# Patient Record
Sex: Female | Born: 1995 | Race: White | Hispanic: No | Marital: Single | Smoking: Former smoker
Health system: Southern US, Community
[De-identification: ages and names within clinical notes are randomized; demographics above are authoritative.]

## PROBLEM LIST (undated history)

## (undated) DIAGNOSIS — F4310 Post-traumatic stress disorder, unspecified: Secondary | ICD-10-CM

## (undated) DIAGNOSIS — F429 Obsessive-compulsive disorder, unspecified: Secondary | ICD-10-CM

## (undated) DIAGNOSIS — M549 Dorsalgia, unspecified: Secondary | ICD-10-CM

## (undated) DIAGNOSIS — F502 Bulimia nervosa, unspecified: Secondary | ICD-10-CM

## (undated) HISTORY — PX: NO PAST SURGERIES: SHX2092

---
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
## 2016-04-25 ENCOUNTER — Emergency Department (HOSPITAL_COMMUNITY)
Admission: EM | Admit: 2016-04-25 | Discharge: 2016-04-25 | Disposition: A | Discharge status: Home / Self Care | Payer: Self-pay | Attending: Dermatology | Admitting: Dermatology

## 2016-04-25 DIAGNOSIS — Z5321 Procedure and treatment not carried out due to patient leaving prior to being seen by health care provider: Secondary | ICD-10-CM | POA: Insufficient documentation

## 2016-04-25 NOTE — ED Notes (Signed)
The pt was not triaged she left whenever she received her vitals  She decided she did not want to stay  left

## 2016-04-30 ENCOUNTER — Emergency Department (HOSPITAL_COMMUNITY): Payer: Self-pay

## 2016-04-30 ENCOUNTER — Encounter (HOSPITAL_COMMUNITY): Payer: Self-pay

## 2016-04-30 ENCOUNTER — Emergency Department (HOSPITAL_COMMUNITY)
Admission: EM | Admit: 2016-04-30 | Discharge: 2016-04-30 | Disposition: A | Discharge status: Home / Self Care | Payer: Self-pay | Attending: Emergency Medicine | Admitting: Emergency Medicine

## 2016-04-30 DIAGNOSIS — F17200 Nicotine dependence, unspecified, uncomplicated: Secondary | ICD-10-CM | POA: Insufficient documentation

## 2016-04-30 DIAGNOSIS — B349 Viral infection, unspecified: Secondary | ICD-10-CM | POA: Insufficient documentation

## 2016-04-30 DIAGNOSIS — K920 Hematemesis: Secondary | ICD-10-CM | POA: Insufficient documentation

## 2016-04-30 LAB — CBC WITH DIFFERENTIAL/PLATELET
BASOS ABS: 0 10*3/uL (ref 0.0–0.1)
BASOS PCT: 0 %
EOS ABS: 0.2 10*3/uL (ref 0.0–0.7)
EOS PCT: 2 %
HCT: 39.3 % (ref 36.0–46.0)
Hemoglobin: 13.4 g/dL (ref 12.0–15.0)
LYMPHS PCT: 41 %
Lymphs Abs: 3.6 10*3/uL (ref 0.7–4.0)
MCH: 30.1 pg (ref 26.0–34.0)
MCHC: 34.1 g/dL (ref 30.0–36.0)
MCV: 88.3 fL (ref 78.0–100.0)
MONO ABS: 0.5 10*3/uL (ref 0.1–1.0)
Monocytes Relative: 6 %
Neutro Abs: 4.5 10*3/uL (ref 1.7–7.7)
Neutrophils Relative %: 51 %
PLATELETS: 366 10*3/uL (ref 150–400)
RBC: 4.45 MIL/uL (ref 3.87–5.11)
RDW: 13.9 % (ref 11.5–15.5)
WBC: 8.8 10*3/uL (ref 4.0–10.5)

## 2016-04-30 LAB — COMPREHENSIVE METABOLIC PANEL
ALK PHOS: 84 U/L (ref 38–126)
ALT: 24 U/L (ref 14–54)
ANION GAP: 9 (ref 5–15)
AST: 23 U/L (ref 15–41)
Albumin: 4.6 g/dL (ref 3.5–5.0)
BUN: 7 mg/dL (ref 6–20)
CALCIUM: 9.4 mg/dL (ref 8.9–10.3)
CO2: 25 mmol/L (ref 22–32)
CREATININE: 0.6 mg/dL (ref 0.44–1.00)
Chloride: 107 mmol/L (ref 101–111)
Glucose, Bld: 95 mg/dL (ref 65–99)
Potassium: 3.9 mmol/L (ref 3.5–5.1)
SODIUM: 141 mmol/L (ref 135–145)
Total Bilirubin: 0.3 mg/dL (ref 0.3–1.2)
Total Protein: 8 g/dL (ref 6.5–8.1)

## 2016-04-30 LAB — I-STAT BETA HCG BLOOD, ED (MC, WL, AP ONLY)

## 2016-04-30 LAB — ETHANOL: ALCOHOL ETHYL (B): 5 mg/dL — AB (ref <5)

## 2016-04-30 LAB — LIPASE, BLOOD: LIPASE: 18 U/L (ref 11–51)

## 2016-04-30 MED ORDER — SUCRALFATE 1 GM/10ML PO SUSP: 1.0000 g | Freq: Three times a day (TID) | ORAL | 0 refills | Status: DC

## 2016-04-30 MED ORDER — ALBUTEROL SULFATE (2.5 MG/3ML) 0.083% IN NEBU
5.0000 mg | INHALATION_SOLUTION | Freq: Once | RESPIRATORY_TRACT | Status: DC
  Filled 2016-04-30: qty 6

## 2016-04-30 MED ORDER — OMEPRAZOLE 20 MG PO CPDR: 20.0000 mg | DELAYED_RELEASE_CAPSULE | Freq: Every day | ORAL | 0 refills | Status: DC

## 2016-04-30 MED ORDER — KETOROLAC TROMETHAMINE 30 MG/ML IJ SOLN: 10.0000 mg | Freq: Once | INTRAMUSCULAR | Status: DC

## 2016-04-30 MED ORDER — SODIUM CHLORIDE 0.9 % IV BOLUS (SEPSIS): 1000.0000 mL | Freq: Once | INTRAVENOUS | Status: DC

## 2016-04-30 NOTE — Discharge Instructions (Signed)
As discussed, today's evaluation has been largely reassuring, but with ongoing symptoms it is important that you follow-up with your primary care physician in addition to our gastroenterologist. Particular important that you monitor your condition, and if you have additional episodes of vomiting blood, or anything concerning, please return here for further evaluation.  Otherwise, please take all medication as directed.

## 2016-04-30 NOTE — ED Notes (Signed)
Discharge instructions, follow up care, and rx x2 reviewed with patient. Patient verbalized understanding. 

## 2016-04-30 NOTE — Progress Notes (Signed)
RT went to assess patient for a breathing treatment. Patient is clear and diminished, no shortness of breath, and no wheezing. Patient does have a cough. Based on the assessment patient does not need the treatment that was ordered.

## 2016-04-30 NOTE — ED Notes (Signed)
Patient transported to X-ray 

## 2016-04-30 NOTE — ED Notes (Signed)
Nurse is in the room collecting labs 

## 2016-04-30 NOTE — ED Notes (Signed)
  Pt. Refused to give vitals. Pt. Stated that she wasn't going to give up no vitals to no one. Nurse aware.

## 2016-04-30 NOTE — ED Notes (Addendum)
Prior to starting IV/blood collection, patient refusing fluids/medication. MD notified and to bedside. Patient agreed to have blood work drawn. After blood work was drawn, patient requesting to leave. Patient tearful. Therapeutic communication used. MD aware patient wants to leave.

## 2016-04-30 NOTE — ED Provider Notes (Signed)
[facility]-EMERGENCY DEPT Provider Note   CSN: [id number] Arrival date & time: 04/30/16  0510     History   Chief Complaint Chief Complaint  Patient presents with  . Cough  . Hemoptysis    HPI Shelby Russell is a 20 y.o. female.  HPI  Patient presents with concern of generalized illness, fatigue, weakness, fever, cough, hemoptysis and hematemesis. Illness began about one week ago. Since onset she has felt generally ill, but no vomiting or hemoptysis until the past 2 days. Now she has had multiple episodes of noticeable blood in her vomit or She denies pain, including abdominal pain, chest pain, does acknowledge nausea. No overt bloody stool. No syncope, no confusion, no disorientation. Patient states that she is generally well, though she acknowledges a history of smoking.  Smoking cessation provided, particularly in light of this patient's evaluation in the ED.   History reviewed. No pertinent past medical history.  There are no active problems to display for this patient.   History reviewed. No pertinent surgical history.  OB History    No data available       Home Medications    Prior to Admission medications   Not on File    Family History History reviewed. No pertinent family history.  Social History Social History  Substance Use Topics  . Smoking status: Current Every Day Smoker  . Smokeless tobacco: Never Used  . Alcohol use Yes     Allergies   Patient has no allergy information on record.   Review of Systems Review of Systems  Constitutional:       Per HPI, otherwise negative  HENT:       Per HPI, otherwise negative  Respiratory:       Per HPI, otherwise negative  Cardiovascular:       Per HPI, otherwise negative  Gastrointestinal: Positive for nausea and vomiting. Negative for abdominal pain.  Endocrine:       Negative aside from HPI  Genitourinary:       Neg aside from HPI   Musculoskeletal:       Per HPI, otherwise negative    Skin: Positive for pallor.  Allergic/Immunologic: Negative for immunocompromised state.  Neurological: Positive for weakness. Negative for syncope.     Physical Exam Updated Vital Signs BP 118/80 (BP Location: Right Arm)   Pulse 88   Temp 98.9 F (37.2 C) (Oral)   Resp 20   LMP 04/29/2016   SpO2 100%   Physical Exam  Constitutional: She is oriented to person, place, and time. She appears well-developed and well-nourished. No distress.  HENT:  Head: Normocephalic and atraumatic.  Eyes: Conjunctivae and EOM are normal.  redness about the eyes  Cardiovascular: Normal rate and regular rhythm.   Pulmonary/Chest: Effort normal and breath sounds normal. No stridor. No respiratory distress.  Abdominal: She exhibits no distension. There is no tenderness. There is no guarding.  Musculoskeletal: She exhibits no edema.  Neurological: She is alert and oriented to person, place, and time. No cranial nerve deficit.  Skin: Skin is warm and dry. There is pallor.  Psychiatric: She has a normal mood and affect.  Slightly evasive, but clear, direct speech  Nursing note and vitals reviewed.    ED Treatments / Results  Labs (all labs ordered are listed, but only abnormal results are displayed) Labs Reviewed  COMPREHENSIVE METABOLIC PANEL  ETHANOL  LIPASE, BLOOD  CBC WITH DIFFERENTIAL/PLATELET  URINALYSIS, ROUTINE W REFLEX MICROSCOPIC (NOT AT [facility])  I-STAT BETA HCG  BLOOD, ED ([facility], [facility], [facility] ONLY)   Radiology Dg Chest 2 View  Result Date: 04/30/2016 CLINICAL DATA:  Cough and cold symptoms for over a week, getting worse. Vomiting blood. Smoker. EXAM: CHEST  2 VIEW COMPARISON:  None. FINDINGS: The heart size and mediastinal contours are within normal limits. Both lungs are clear. The visualized skeletal structures are unremarkable. IMPRESSION: No active cardiopulmonary disease. Electronically Signed   By: Burman NievesWilliam  Stevens M.D.   On: 04/30/2016 05:49   Procedures Procedures (including critical  care time)  Medications Ordered in ED Medications  albuterol (PROVENTIL) (2.5 MG/3ML) 0.083% nebulizer solution 5 mg (5 mg Nebulization Not Given 04/30/16 0619)  sodium chloride 0.9 % bolus 1,000 mL (not administered)  ketorolac (TORADOL) 30 MG/ML injection 9.9 mg (not administered)    Initial Impression / Assessment and Plan / ED Course  I have reviewed the triage vital signs and the nursing notes.  Pertinent labs & imaging results that were available during my care of the patient were reviewed by me and considered in my medical decision making (see chart for details).  Clinical Course     7:56 AM Patient requests no IVF, no IV meds.  She states that she cannot stay for complete evaluation.  9:13 AM We discussed lab results, the patientthat she has had some degree of pain, questions possibility of gastric ulcers. We discussed this possibility, importance of following up with primary care, gastroenterology, particularly if she continues to have symptoms.   Final Clinical Impressions(s) / ED Diagnoses  Young female presents with one week of illness, concern for new hemoptysis and/or hematemesis. Here she is awake, alert, but hemodynamically stable. I advocated for fluid resuscitation, monitoring in addition to labs. Initial x-ray reassuring, labs reassuring. The initial studies were reassuring, and she was provided appropriate discharge follow-up instructions, both with primary care and gastroenterology.  Patient discharged with a course of Carafate, PPI.   Gerhard Munchobert Lateasha Breuer, MD 04/30/16 [phone]

## 2016-04-30 NOTE — ED Triage Notes (Signed)
Pt states that she's had a bad cold for over a week and her coughing is getting worse,  The last few days she has been vomiting blood and this last time it was dark and looked like coffee grounds in the center

## 2016-06-07 ENCOUNTER — Encounter (HOSPITAL_COMMUNITY): Payer: Self-pay | Admitting: *Deleted

## 2018-09-20 IMAGING — CR DG CHEST 2V
2 series · 2 of 2 positions shown · non-contrast
Comparison: None.

CLINICAL DATA: Cough and cold symptoms for over a week, getting
worse. Vomiting blood. Smoker.

EXAM:
CHEST  2 VIEW

[w chest pa]
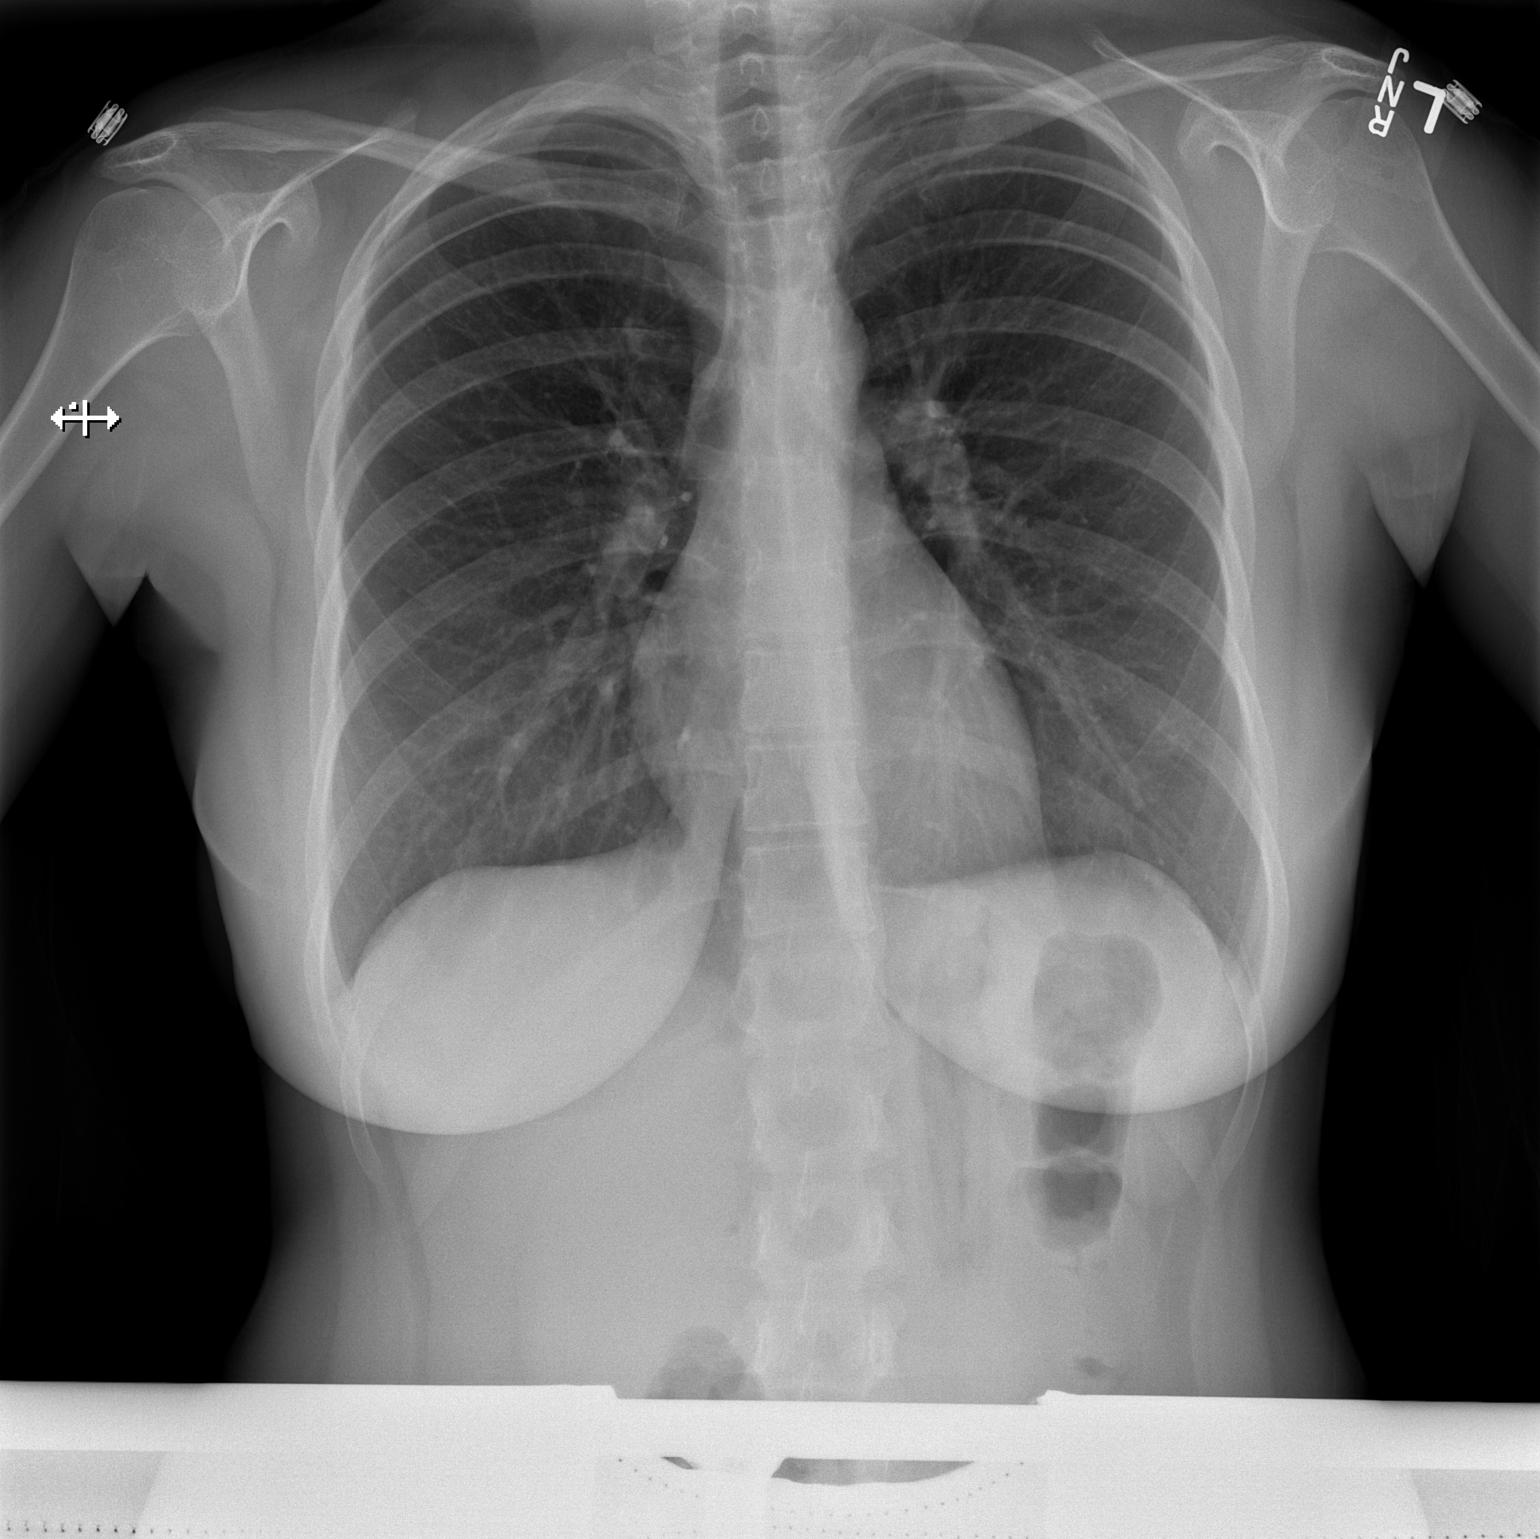

[w chest lat]
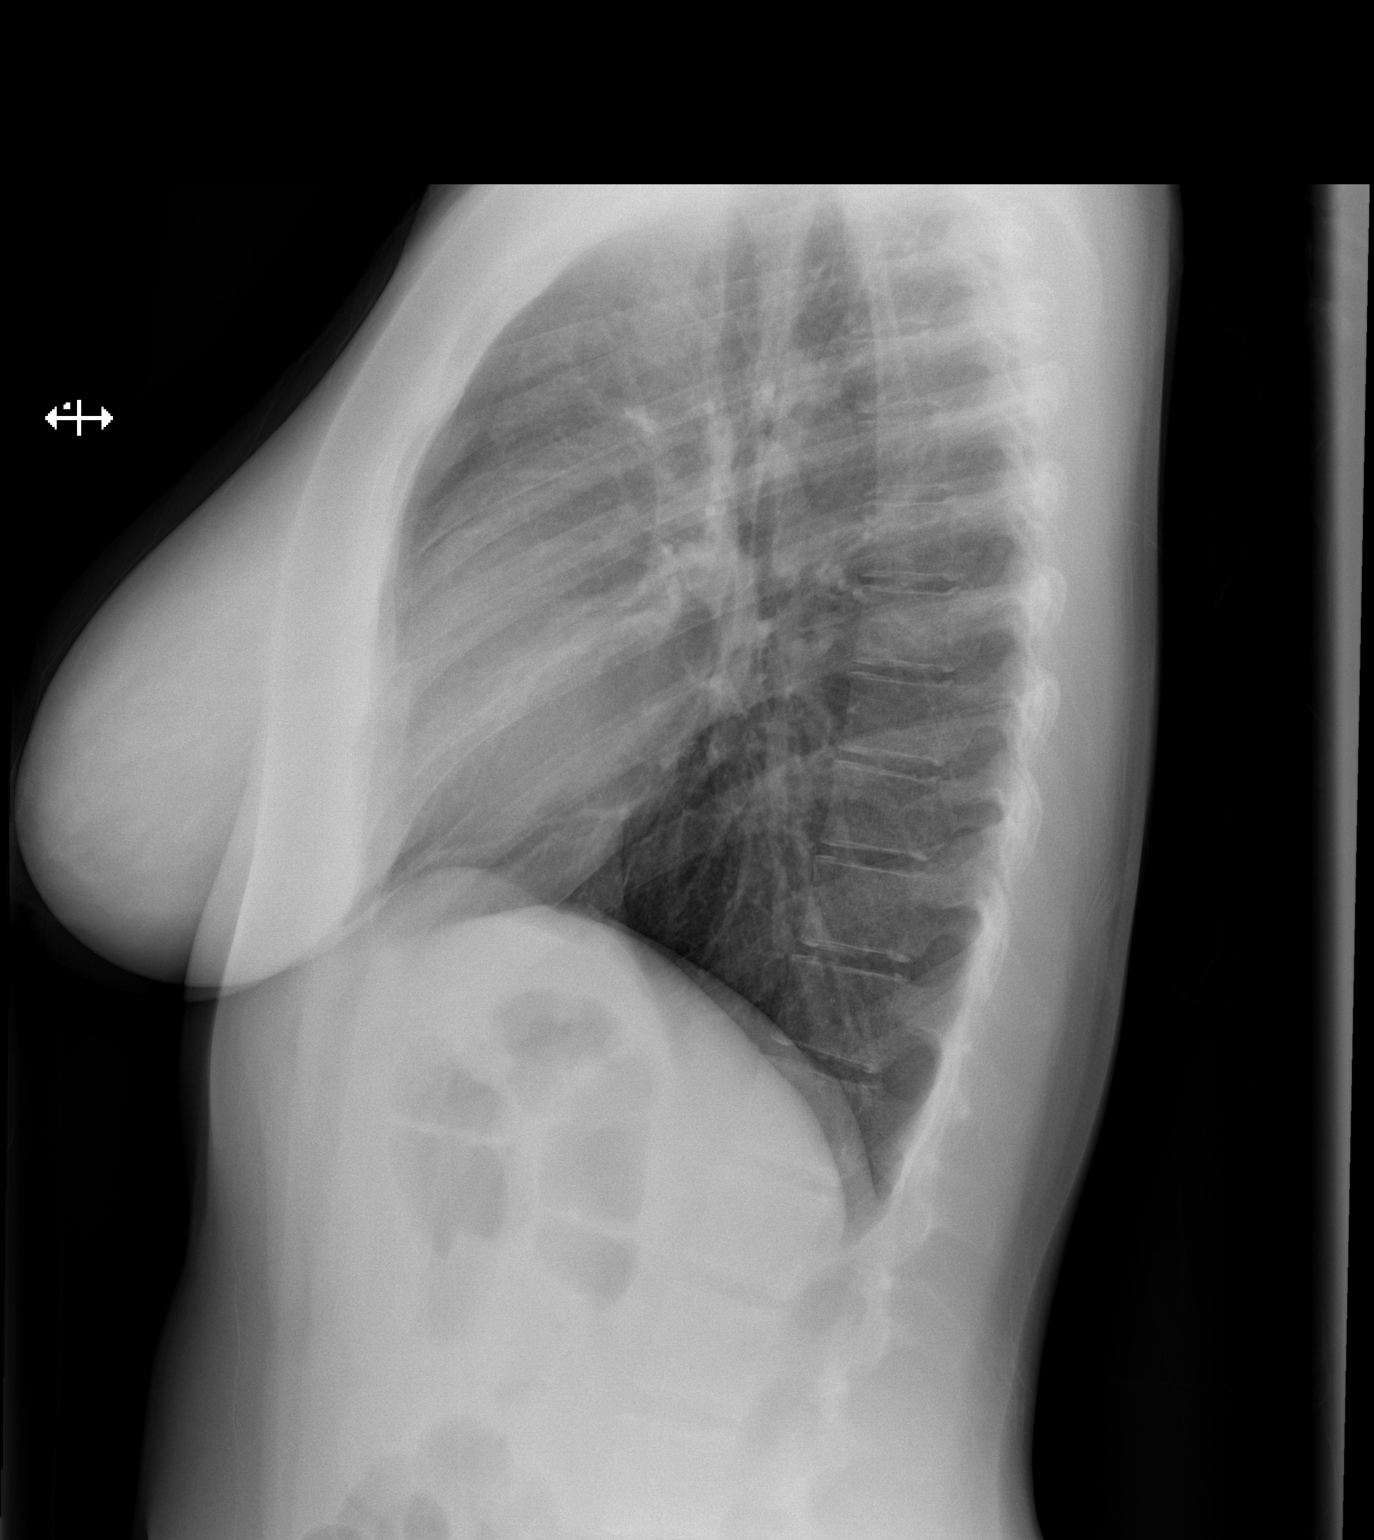

[2 of 2 positions shown; findings below may reference images not displayed]

FINDINGS: The heart size and mediastinal contours are within normal limits.
Both lungs are clear. The visualized skeletal structures are
unremarkable.
IMPRESSION: No active cardiopulmonary disease.

## 2023-02-12 ENCOUNTER — Ambulatory Visit: Payer: Self-pay

## 2023-02-15 ENCOUNTER — Ambulatory Visit (HOSPITAL_COMMUNITY)
Admission: EM | Admit: 2023-02-15 | Discharge: 2023-02-15 | Disposition: A | Discharge status: Home / Self Care | Payer: BC Managed Care – PPO | Source: Home / Self Care | Attending: Family Medicine | Admitting: Family Medicine

## 2023-02-15 ENCOUNTER — Encounter (HOSPITAL_COMMUNITY): Payer: Self-pay

## 2023-02-15 DIAGNOSIS — R051 Acute cough: Secondary | ICD-10-CM | POA: Diagnosis not present

## 2023-02-15 DIAGNOSIS — J069 Acute upper respiratory infection, unspecified: Secondary | ICD-10-CM | POA: Diagnosis not present

## 2023-02-15 MED ORDER — PROMETHAZINE-DM 6.25-15 MG/5ML PO SYRP: 5.0000 mL | ORAL_SOLUTION | Freq: Four times a day (QID) | ORAL | 0 refills | Status: DC | PRN

## 2023-02-15 NOTE — ED Triage Notes (Signed)
Here for sore throat, cough and diarrhea x 3 days.

## 2023-02-17 NOTE — ED Provider Notes (Signed)
  [facility]   [id number] 02/15/23 Arrival Time: 1421  ASSESSMENT & PLAN:  1. Viral URI   2. Acute cough    Discussed typical duration of likely viral illness. OTC symptom care as needed.  Discharge Medication List as of 02/15/2023  5:22 PM     START taking these medications   Details  promethazine-dextromethorphan (PROMETHAZINE-DM) 6.25-15 MG/5ML syrup Take 5 mLs by mouth 4 (four) times daily as needed for cough., Starting Sat 02/15/2023, Normal         Follow-up Information     [facility].   Specialty: Urgent Care Why: If worsening or failing to improve as anticipated. Contact information: [address] [place] [address] [phone]                Reviewed expectations re: course of current medical issues. Questions answered. Outlined signs and symptoms indicating need for more acute intervention. Understanding verbalized. After Visit Summary given.   SUBJECTIVE: History from: Patient. Shelby Russell is a 27 y.o. female. Here for sore throat, cough and diarrhea x 3 days.  Denies: fever and difficulty breathing. Normal PO intake without n/v/d.  OBJECTIVE:  Vitals:   02/15/23 1622  BP: 123/82  Pulse: 88  Resp: 16  Temp: 98.7 F (37.1 C)  TempSrc: Oral  SpO2: 96%    General appearance: alert; no distress Eyes: PERRLA; EOMI; conjunctiva normal HENT: [place]; AT; with nasal congestion Neck: supple  Lungs: speaks full sentences without difficulty; unlabored; dry cough Extremities: no edema Skin: warm and dry Neurologic: normal gait Psychological: alert and cooperative; normal mood and affect    Allergies  Allergen Reactions   Lamictal [Lamotrigine] Itching and Rash    Past Medical History:  Diagnosis Date   Abdominal pain 08/26/2011   Recent on-set.   Back pain 08/26/2011   Recent on-set.   Social History   Socioeconomic History   Marital status: Single    Spouse name: Not on  file   Number of children: Not on file   Years of education: Not on file   Highest education level: Not on file  Occupational History   Not on file  Tobacco Use   Smoking status: Every Day   Smokeless tobacco: Never  Substance and Sexual Activity   Alcohol use: Yes   Drug use: No   Sexual activity: Not on file  Other Topics Concern   Not on file  Social History Narrative   ** Merged History Encounter **       Social Determinants of Health   Financial Resource Strain: Not on file  Food Insecurity: Not on file  Transportation Needs: Not on file  Physical Activity: Not on file  Stress: Not on file  Social Connections: Not on file  Intimate Partner Violence: Not on file   History reviewed. No pertinent family history. Past Surgical History:  Procedure Laterality Date   NO PAST SURGERIES       Mardella Layman, MD 02/17/23 [phone]

## 2023-03-11 ENCOUNTER — Ambulatory Visit (HOSPITAL_COMMUNITY): Payer: BC Managed Care – PPO

## 2023-03-13 ENCOUNTER — Ambulatory Visit (HOSPITAL_COMMUNITY): Payer: BC Managed Care – PPO

## 2023-03-26 ENCOUNTER — Ambulatory Visit (HOSPITAL_COMMUNITY): Payer: BC Managed Care – PPO

## 2023-07-22 ENCOUNTER — Ambulatory Visit: Payer: BC Managed Care – PPO

## 2023-07-24 DIAGNOSIS — F422 Mixed obsessional thoughts and acts: Secondary | ICD-10-CM | POA: Diagnosis not present

## 2023-07-24 DIAGNOSIS — F411 Generalized anxiety disorder: Secondary | ICD-10-CM | POA: Diagnosis not present

## 2023-07-24 DIAGNOSIS — F4312 Post-traumatic stress disorder, chronic: Secondary | ICD-10-CM | POA: Diagnosis not present

## 2023-08-21 DIAGNOSIS — F422 Mixed obsessional thoughts and acts: Secondary | ICD-10-CM | POA: Diagnosis not present

## 2023-08-21 DIAGNOSIS — F4312 Post-traumatic stress disorder, chronic: Secondary | ICD-10-CM | POA: Diagnosis not present

## 2023-08-21 DIAGNOSIS — F411 Generalized anxiety disorder: Secondary | ICD-10-CM | POA: Diagnosis not present

## 2023-08-21 DIAGNOSIS — F902 Attention-deficit hyperactivity disorder, combined type: Secondary | ICD-10-CM | POA: Diagnosis not present

## 2023-10-14 DIAGNOSIS — F4312 Post-traumatic stress disorder, chronic: Secondary | ICD-10-CM | POA: Diagnosis not present

## 2023-10-14 DIAGNOSIS — F411 Generalized anxiety disorder: Secondary | ICD-10-CM | POA: Diagnosis not present

## 2023-10-14 DIAGNOSIS — F422 Mixed obsessional thoughts and acts: Secondary | ICD-10-CM | POA: Diagnosis not present

## 2023-10-20 ENCOUNTER — Other Ambulatory Visit: Payer: Self-pay

## 2023-10-20 ENCOUNTER — Emergency Department (HOSPITAL_COMMUNITY)

## 2023-10-20 ENCOUNTER — Emergency Department (HOSPITAL_COMMUNITY)
Admission: EM | Admit: 2023-10-20 | Discharge: 2023-10-20 | Disposition: A | Discharge status: Home / Self Care | Attending: Emergency Medicine | Admitting: Emergency Medicine

## 2023-10-20 ENCOUNTER — Encounter (HOSPITAL_COMMUNITY): Payer: Self-pay

## 2023-10-20 DIAGNOSIS — K529 Noninfective gastroenteritis and colitis, unspecified: Secondary | ICD-10-CM

## 2023-10-20 DIAGNOSIS — R0789 Other chest pain: Secondary | ICD-10-CM | POA: Diagnosis not present

## 2023-10-20 DIAGNOSIS — N3000 Acute cystitis without hematuria: Secondary | ICD-10-CM | POA: Insufficient documentation

## 2023-10-20 DIAGNOSIS — R079 Chest pain, unspecified: Secondary | ICD-10-CM | POA: Diagnosis not present

## 2023-10-20 DIAGNOSIS — R112 Nausea with vomiting, unspecified: Secondary | ICD-10-CM | POA: Diagnosis not present

## 2023-10-20 LAB — URINALYSIS, W/ REFLEX TO CULTURE (INFECTION SUSPECTED)
Bilirubin Urine: NEGATIVE
Glucose, UA: NEGATIVE mg/dL
Hgb urine dipstick: NEGATIVE
Ketones, ur: NEGATIVE mg/dL
Leukocytes,Ua: NEGATIVE
Nitrite: POSITIVE — AB
Protein, ur: NEGATIVE mg/dL
Specific Gravity, Urine: 1.023 (ref 1.005–1.030)
pH: 5 (ref 5.0–8.0)

## 2023-10-20 LAB — RESP PANEL BY RT-PCR (RSV, FLU A&B, COVID)  RVPGX2
Influenza A by PCR: NEGATIVE
Influenza B by PCR: NEGATIVE
Resp Syncytial Virus by PCR: NEGATIVE
SARS Coronavirus 2 by RT PCR: NEGATIVE

## 2023-10-20 LAB — BASIC METABOLIC PANEL WITH GFR
Anion gap: 10 (ref 5–15)
BUN: 11 mg/dL (ref 6–20)
CO2: 21 mmol/L — ABNORMAL LOW (ref 22–32)
Calcium: 9 mg/dL (ref 8.9–10.3)
Chloride: 106 mmol/L (ref 98–111)
Creatinine, Ser: 0.71 mg/dL (ref 0.44–1.00)
GFR, Estimated: >60 mL/min (ref >60)
Glucose, Bld: 94 mg/dL (ref 70–99)
Potassium: 3.8 mmol/L (ref 3.5–5.1)
Sodium: 137 mmol/L (ref 135–145)

## 2023-10-20 LAB — CBC
HCT: 36.1 % (ref 36.0–46.0)
Hemoglobin: 11.7 g/dL — ABNORMAL LOW (ref 12.0–15.0)
MCH: 28.1 pg (ref 26.0–34.0)
MCHC: 32.4 g/dL (ref 30.0–36.0)
MCV: 86.6 fL (ref 80.0–100.0)
Platelets: 436 10*3/uL — ABNORMAL HIGH (ref 150–400)
RBC: 4.17 MIL/uL (ref 3.87–5.11)
RDW: 16.3 % — ABNORMAL HIGH (ref 11.5–15.5)
WBC: 8.6 10*3/uL (ref 4.0–10.5)
nRBC: 0 % (ref 0.0–0.2)

## 2023-10-20 LAB — HEPATIC FUNCTION PANEL
ALT: 18 U/L (ref 0–44)
AST: 18 U/L (ref 15–41)
Albumin: 3.8 g/dL (ref 3.5–5.0)
Alkaline Phosphatase: 61 U/L (ref 38–126)
Bilirubin, Direct: <0.1 mg/dL (ref 0.0–0.2)
Total Bilirubin: 0.5 mg/dL (ref 0.0–1.2)
Total Protein: 7 g/dL (ref 6.5–8.1)

## 2023-10-20 LAB — TROPONIN I (HIGH SENSITIVITY)
Troponin I (High Sensitivity): <2 ng/L (ref <18)
Troponin I (High Sensitivity): <2 ng/L (ref <18)

## 2023-10-20 LAB — HCG, SERUM, QUALITATIVE: Preg, Serum: NEGATIVE

## 2023-10-20 MED ORDER — CEPHALEXIN 500 MG PO CAPS
500.0000 mg | ORAL_CAPSULE | Freq: Two times a day (BID) | ORAL | 0 refills | Status: AC
Start: 2023-10-20 — End: 2023-10-27

## 2023-10-20 MED ORDER — CEPHALEXIN 250 MG PO CAPS
500.0000 mg | ORAL_CAPSULE | Freq: Once | ORAL | Status: AC
  Administered 2023-10-20: 500 mg via ORAL
  Filled 2023-10-20: qty 2

## 2023-10-20 NOTE — ED Provider Notes (Signed)
  EMERGENCY DEPARTMENT AT [facility] Provider Note   CSN: [id number] Arrival date & time: 10/20/23  0101     History  Chief Complaint  Patient presents with   Chest Pain    Shelby Russell is a 28 y.o. female who presents with concern for nausea vomiting diarrhea for the last 2 days.  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  Soreness in her ribs with vomiting but no shortness of breath or chest pain.  Please that she presented to the emergency room tonight because she was concerned that her upper lip looked blue.  Additionally patient endorses urinary urgency and frequency the last couple of days.  Took Azo today with minimal improvement.  HPI     Home Medications Prior to Admission medications   Medication Sig Start Date End Date Taking? Authorizing Provider  divalproex (DEPAKOTE) 125 MG DR tablet Take 125 mg by mouth 2 (two) times daily. Milligram dosage and type of release unknown; father describes pill as blue and white.    Henery Locket, MD  gabapentin (NEURONTIN) 300 MG capsule Take 1 capsule by mouth 3 (three) times daily. 05/17/22   [provider]  omeprazole  (PRILOSEC) 20 MG capsule Take 1 capsule (20 mg total) by mouth daily. Take one tablet daily 04/30/16   Dorenda Gandy, MD  promethazine -dextromethorphan (PROMETHAZINE -DM) 6.25-15 MG/5ML syrup Take 5 mLs by mouth 4 (four) times daily as needed for cough. 02/15/23   Afton Albright, MD  QUEtiapine (SEROQUEL) 100 MG tablet Take 100 mg by mouth at bedtime. Milligram dosage unknown.  Pt does not take it.    Henery Locket, MD  risperiDONE (RISPERDAL) 0.25 MG tablet Take 0.25 mg by mouth at bedtime and may repeat dose one time if needed. Milligram dosage unknown; pt describes as a pink oval    Henery Locket, MD  sucralfate  (CARAFATE ) 1 GM/10ML suspension Take 10 mLs (1 g total) by mouth 4 (four) times daily -  with meals and at bedtime. Please use three times daily for the next five  days. 04/30/16   Dorenda Gandy, MD      Allergies    Lamictal [lamotrigine]    Review of Systems   Review of Systems  Constitutional:  Positive for appetite change.  HENT: Negative.    Gastrointestinal:  Positive for diarrhea, nausea and vomiting. Negative for blood in stool (no melena).  Genitourinary:  Positive for frequency and urgency. Negative for decreased urine volume, dysuria, vaginal bleeding, vaginal discharge and vaginal pain.  Neurological: Negative.     Physical Exam Updated Vital Signs BP 116/85 (BP Location: Right Arm)   Pulse 88   Temp 98 F (36.7 C) (Oral)   Resp 18   Ht 5\' 2"  (1.575 m)   Wt 59.4 kg   LMP 10/13/2023   SpO2 100%   BMI 23.96 kg/m  Physical Exam Vitals and nursing note reviewed.  Constitutional:      Appearance: She is not ill-appearing or toxic-appearing.  HENT:     Head: Normocephalic and atraumatic.     Comments: Lips are pink.    Mouth/Throat:     Mouth: Mucous membranes are moist.     Pharynx: No oropharyngeal exudate or posterior oropharyngeal erythema.  Eyes:     General:        Right eye: No discharge.        Left eye: No discharge.     Conjunctiva/sclera: Conjunctivae normal.  Cardiovascular:  Rate and Rhythm: Normal rate and regular rhythm.     Pulses: Normal pulses.     Heart sounds: Normal heart sounds. No murmur heard. Pulmonary:     Effort: Pulmonary effort is normal. No respiratory distress.     Breath sounds: Normal breath sounds. No wheezing or rales.  Chest:     Chest wall: No mass, tenderness or edema.  Abdominal:     General: Bowel sounds are normal. There is no distension.     Palpations: Abdomen is soft.     Tenderness: There is no abdominal tenderness.  Musculoskeletal:        General: No deformity.     Cervical back: Neck supple.     Right lower leg: No edema.     Left lower leg: No edema.  Skin:    General: Skin is warm and dry.     Capillary Refill: Capillary refill takes less than 2 seconds.   Neurological:     Mental Status: She is alert. Mental status is at baseline.  Psychiatric:        Mood and Affect: Mood normal.     ED Results / Procedures / Treatments   Labs (all labs ordered are listed, but only abnormal results are displayed) Labs Reviewed  BASIC METABOLIC PANEL WITH GFR - Abnormal; Notable for the following components:      Result Value   CO2 21 (*)    All other components within normal limits  CBC - Abnormal; Notable for the following components:   Hemoglobin 11.7 (*)    RDW 16.3 (*)    Platelets 436 (*)    All other components within normal limits  URINALYSIS, W/ REFLEX TO CULTURE (INFECTION SUSPECTED) - Abnormal; Notable for the following components:   Color, Urine ORANGE (*)    APPearance HAZY (*)    Nitrite POSITIVE (*)    Bacteria, UA FEW (*)    All other components within normal limits  RESP PANEL BY RT-PCR (RSV, FLU A&B, COVID)  RVPGX2  HCG, SERUM, QUALITATIVE  HEPATIC FUNCTION PANEL  TROPONIN I (HIGH SENSITIVITY)  TROPONIN I (HIGH SENSITIVITY)    EKG EKG Interpretation Date/Time:  Monday October 20 2023 01:09:43 EDT Ventricular Rate:  93 PR Interval:  152 QRS Duration:  92 QT Interval:  384 QTC Calculation: 477 R Axis:   86  Text Interpretation: Normal sinus rhythm Normal ECG No previous ECGs available Interpretation limited secondary to artifact Confirmed by Eldon Greenland ([id number]) on 10/20/2023 2:10:45 AM  Radiology DG Chest 2 View Result Date: 10/20/2023 EXAM: 2 VIEW(S) XRAY OF THE CHEST 10/20/2023 01:58:02 AM COMPARISON: 04/30/2016 CLINICAL HISTORY: Chest pain. Nausea and vomiting for the last 2-3 days. Today she has pain around the ribcage and up through the center of her chest. FINDINGS: LUNGS AND PLEURA: No consolidation. No pulmonary edema. No pleural effusion. No pneumothorax. HEART AND MEDIASTINUM: No acute abnormality of the cardiac and mediastinal silhouettes. BONES AND SOFT TISSUES: No acute osseous abnormality. IMPRESSION: 1.  No acute process. Electronically signed by: Zadie Herter MD 10/20/2023 01:59 AM EDT RP Workstation: [id number]    Procedures Procedures    Medications Ordered in ED Medications  cephALEXin (KEFLEX) capsule 500 mg (has no administration in time range)    ED Course/ Medical Decision Making/ A&P                                 Medical Decision Making 28 year old  female with nausea vomiting diarrhea for the last few days, concerned that her lips were blue at home.  Hypertensive on intake vital signs otherwise normal.  Cardiopulmonary abdominal exams are benign.  Patient is well-appearing, no evidence of infectious findings on oropharyngeal exam.  Mucous membranes are moist, lips are nice and pink, cap refill less than 2 seconds and heart rate is normal.  Oxygen saturation 100% on room air here overstating the emergency department.   Amount and/or Complexity of Data Reviewed Labs: ordered.    Details:  CBC with mild anemia with hemoglobin of 11.7.  BMP unremarkable, hepatic function panel is reassuring.  RVP is pending at this time.    Pregnancy test is negative.   UA with signs concerning for infection. Radiology: ordered.    Details: Chest x-ray negative for acute cardiopulmonary disease ECG/medicine tests:     Details: EKG revealed normal sinus rhythm.  Risk Prescription drug management.   Clinical picture most consistent with likely viral gastroenteritis.  Clinical concern for emergent underlying condition that would warrant further ED workup or inpatient management is exceedingly low.  Will also treat with antibiotics for urinary tract infection.    Adenike voiced understanding of her medical evaluation and treatment plan. Each of their questions answered to their expressed satisfaction.  Return precautions were given.  Patient is well-appearing, stable, and was discharged in good condition.  This chart was dictated using voice recognition software, Dragon. Despite the best  efforts of this provider to proofread and correct errors, errors may still occur which can change documentation meaning.         Final Clinical Impression(s) / ED Diagnoses Final diagnoses:  Acute cystitis without hematuria    Rx / DC Orders ED Discharge Orders     None         Arlyne Lame 10/20/23 0431    Eldon Greenland, MD 10/20/23 [phone]

## 2023-10-20 NOTE — ED Notes (Signed)
 Lab to add on hepatitis

## 2023-10-20 NOTE — ED Triage Notes (Signed)
 Complaining of nausea and vomiting for the last 2-3 days. Today she has pain around the ribcage and up through the center of her chest. She said that her upper lip looks a little blue to her. Pain is sharp on the upper and dull under the breast.

## 2023-10-20 NOTE — Discharge Instructions (Signed)
 You have a urine tract infection today.  You have been started on antibiotics which you should take for the entire course.  Please follow-up in the outpatient setting with the physician listed below and return to the ER with any breakthrough symptoms.

## 2023-11-05 DIAGNOSIS — J029 Acute pharyngitis, unspecified: Secondary | ICD-10-CM | POA: Diagnosis not present

## 2023-12-25 ENCOUNTER — Ambulatory Visit (HOSPITAL_COMMUNITY)
Admission: RE | Admit: 2023-12-25 | Discharge: 2023-12-25 | Disposition: A | Discharge status: Home / Self Care | Source: Ambulatory Visit | Attending: Family Medicine | Admitting: Family Medicine

## 2023-12-25 ENCOUNTER — Encounter (HOSPITAL_COMMUNITY): Payer: Self-pay

## 2023-12-25 VITALS — BP 129/92 | HR 101 | Temp 98.3°F | Resp 18

## 2023-12-25 DIAGNOSIS — K0889 Other specified disorders of teeth and supporting structures: Secondary | ICD-10-CM | POA: Diagnosis not present

## 2023-12-25 HISTORY — DX: Bulimia nervosa, unspecified: F50.20

## 2023-12-25 HISTORY — DX: Post-traumatic stress disorder, unspecified: F43.10

## 2023-12-25 HISTORY — DX: Obsessive-compulsive disorder, unspecified: F42.9

## 2023-12-25 MED ORDER — AMOXICILLIN-POT CLAVULANATE 875-125 MG PO TABS: 1.0000 | ORAL_TABLET | Freq: Two times a day (BID) | ORAL | 0 refills | Status: DC

## 2023-12-25 NOTE — ED Triage Notes (Signed)
 Pt states she has a broken molar on the both lower sides. She was told by a dentists to come and get on antibiotics.

## 2023-12-25 NOTE — ED Provider Notes (Signed)
  [facility] CARE CENTER   [id number] 12/25/23 Arrival Time: 1430  ASSESSMENT & PLAN:  1. Pain, dental    No sign of abscess requiring I&D at this time. Discussed.  Meds ordered this encounter  Medications   amoxicillin-clavulanate (AUGMENTIN) 875-125 MG tablet    Sig: Take 1 tablet by mouth every 12 (twelve) hours.    Dispense:  14 tablet    Refill:  0  Tylenol as needed.  She will schedule dental evaluation as soon as possible if not improving over the next 24-48 hours.  Reviewed expectations re: course of current medical issues. Questions answered. Outlined signs and symptoms indicating need for more acute intervention. Patient verbalized understanding. After Visit Summary given.   SUBJECTIVE:  Shelby Russell is a 28 y.o. female who states that she has a broken molar; long-standing; left side; is tender. She was told by a dentists to come and get on antibiotics.  Denies fever. Tolerating PO without difficulty.  OBJECTIVE: Vitals:   12/25/23 1503  BP: (!) 129/92  Pulse: (!) 101  Resp: 18  Temp: 98.3 F (36.8 C)  TempSrc: Oral  SpO2: 97%    General appearance: alert; no distress HENT: normocephalic; atraumatic; fair dentition; broken L molar; without obvious abscess Neck: supple without LAD; FROM; trachea midline Lungs: normal respirations; unlabored; speaks full sentences without difficulty Skin: warm and dry Psychological: alert and cooperative; normal mood and affect  Allergies  Allergen Reactions   Lamictal [Lamotrigine] Itching and Rash    Past Medical History:  Diagnosis Date   Abdominal pain 08/26/2011   Recent on-set.   Back pain 08/26/2011   Recent on-set.   Bulimic    OCD (obsessive compulsive disorder)    PTSD (post-traumatic stress disorder)    Social History   Socioeconomic History   Marital status: Single    Spouse name: Not on file   Number of children: Not on file   Years of education: Not on file   Highest education level: Not on  file  Occupational History   Not on file  Tobacco Use   Smoking status: Former    Types: Cigarettes   Smokeless tobacco: Never  Vaping Use   Vaping status: Every Day  Substance and Sexual Activity   Alcohol use: Not Currently   Drug use: Not Currently   Sexual activity: Yes    Birth control/protection: None  Other Topics Concern   Not on file  Social History Narrative   ** Merged History Encounter **       Social Drivers of Corporate investment banker Strain: Not on file  Food Insecurity: Not on file  Transportation Needs: Not on file  Physical Activity: Not on file  Stress: Not on file  Social Connections: Unknown (03/12/2023)   Received from [facility]   Social Network    Social Network: Not on file  Intimate Partner Violence: Unknown (03/12/2023)   Received from [facility]   HITS    Physically Hurt: Not on file    Insult or Talk Down To: Not on file    Threaten Physical Harm: Not on file    Scream or Curse: Not on file   History reviewed. No pertinent family history. Past Surgical History:  Procedure Laterality Date   NO PAST SURGERIES        Rolinda Rogue, MD 12/25/23 1616

## 2024-01-21 ENCOUNTER — Ambulatory Visit (HOSPITAL_COMMUNITY)

## 2024-01-21 ENCOUNTER — Ambulatory Visit

## 2024-01-28 ENCOUNTER — Ambulatory Visit (HOSPITAL_COMMUNITY)

## 2024-01-30 ENCOUNTER — Ambulatory Visit

## 2024-02-03 ENCOUNTER — Ambulatory Visit

## 2024-02-20 ENCOUNTER — Encounter (HOSPITAL_COMMUNITY): Payer: Self-pay

## 2024-02-20 ENCOUNTER — Ambulatory Visit (HOSPITAL_COMMUNITY)
Admission: RE | Admit: 2024-02-20 | Discharge: 2024-02-20 | Disposition: A | Discharge status: Home / Self Care | Source: Ambulatory Visit

## 2024-02-20 VITALS — BP 125/87 | HR 82 | Temp 98.4°F | Resp 16

## 2024-02-20 DIAGNOSIS — Z20822 Contact with and (suspected) exposure to covid-19: Secondary | ICD-10-CM

## 2024-02-20 LAB — POC SARS CORONAVIRUS 2 AG -  ED: SARS Coronavirus 2 Ag: NEGATIVE

## 2024-02-20 NOTE — ED Provider Notes (Signed)
 [facility]  Note:  This document was prepared using Dragon voice recognition software and may include unintentional dictation errors.  MRN: [id number] DOB: Apr 21, 1996  Subjective:   Shelby Russell is a 28 y.o. female presenting for sore throat, generalized bodyaches, chills x 3 days.  Patient reports that her roommate tested positive for COVID yesterday.  Patient would like testing today to rule out COVID as a possible cause of symptoms.  Patient also reports that she is currently on her menstrual cycle which she believed was causing the body aches and chills.  Patient denies taking any over-the-counter medication to treat symptoms.  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  No current facility-administered medications for this encounter.  Current Outpatient Medications:    amoxicillin -clavulanate (AUGMENTIN ) 875-125 MG tablet, Take 1 tablet by mouth every 12 (twelve) hours. (Patient not taking: Reported on 02/20/2024), Disp: 14 tablet, Rfl: 0   amphetamine-dextroamphetamine (ADDERALL XR) 10 MG 24 hr capsule, Take 10 mg by mouth every morning., Disp: , Rfl:    imipramine (TOFRANIL) 25 MG tablet, Take by mouth., Disp: , Rfl:    OLANZapine-FLUoxetine (SYMBYAX) 3-25 MG capsule, Take 1 capsule by mouth at bedtime., Disp: , Rfl:    Allergies  Allergen Reactions   Lamictal [Lamotrigine] Itching and Rash    Past Medical History:  Diagnosis Date   Abdominal pain 08/26/2011   Recent on-set.   Back pain 08/26/2011   Recent on-set.   Bulimic    OCD (obsessive compulsive disorder)    PTSD (post-traumatic stress disorder)      Past Surgical History:  Procedure Laterality Date   NO PAST SURGERIES      No family history on file.  Social History   Tobacco Use   Smoking status: Former    Types: Cigarettes   Smokeless tobacco: Never  Vaping Use   Vaping status: Every Day   Substance Use Topics   Alcohol use: Not Currently   Drug use: Not Currently    ROS Refer to HPI for ROS details.  Objective:   Vitals: BP 125/87 (BP Location: Right Arm)   Pulse 82   Temp 98.4 F (36.9 C)   Resp 16   LMP 02/19/2024 (Exact Date)   SpO2 97%   Physical Exam Vitals and nursing note reviewed.  Constitutional:      General: She is not in acute distress.    Appearance: Normal appearance. She is well-developed. She is not ill-appearing or toxic-appearing.  HENT:     Head: Normocephalic and atraumatic.     Mouth/Throat:     Mouth: Mucous membranes are moist.     Pharynx: Oropharynx is clear. Posterior oropharyngeal erythema present. No pharyngeal swelling or oropharyngeal exudate.  Cardiovascular:     Rate and Rhythm: Normal rate.  Pulmonary:     Effort: Pulmonary effort is normal. No respiratory distress.     Breath sounds: No stridor. No wheezing.  Chest:     Chest wall: No tenderness.  Skin:    General: Skin is warm and dry.  Neurological:     General: No focal deficit present.     Mental Status: She is alert and oriented to person, place, and time.  Psychiatric:        Mood and Affect: Mood normal.        Behavior: Behavior normal.     Procedures  Results for orders  placed or performed during the hospital encounter of 02/20/24 (from the past 24 hours)  POC SARS Coronavirus 2 Ag-ED - Nasal Swab     Status: None   Collection Time: 02/20/24  7:03 PM  Result Value Ref Range   SARS Coronavirus 2 Ag Negative Negative    No results found.   Assessment and Plan :     Discharge Instructions       1. Exposure to COVID-19 virus (Primary) - POC SARS Coronavirus 2 Ag-ED - Nasal Swab completed in UC is negative -Continue to monitor symptoms for any change in severity if there is any escalation of current symptoms or development of new symptoms follow-up in ER for further evaluation and management.      Cy Bresee B Shaneal Barasch   Anav Lammert, [place]  B, [place] 02/20/24 [phone]

## 2024-02-20 NOTE — ED Triage Notes (Signed)
 Pt c/o sore throat, generalized body aches and chills x's 3 days  Pt st's her roommate tested positive for covid yesterday

## 2024-02-20 NOTE — Discharge Instructions (Signed)
  1. Exposure to COVID-19 virus (Primary) - POC SARS Coronavirus 2 Ag-ED - Nasal Swab completed in UC is negative -Continue to monitor symptoms for any change in severity if there is any escalation of current symptoms or development of new symptoms follow-up in ER for further evaluation and management.

## 2024-03-18 ENCOUNTER — Ambulatory Visit (HOSPITAL_COMMUNITY)
Admission: EM | Admit: 2024-03-18 | Discharge: 2024-03-18 | Disposition: A | Discharge status: Home / Self Care | Attending: Family Medicine | Admitting: Family Medicine

## 2024-03-18 ENCOUNTER — Ambulatory Visit

## 2024-03-18 ENCOUNTER — Encounter (HOSPITAL_COMMUNITY): Payer: Self-pay

## 2024-03-18 DIAGNOSIS — B9689 Other specified bacterial agents as the cause of diseases classified elsewhere: Secondary | ICD-10-CM | POA: Diagnosis not present

## 2024-03-18 DIAGNOSIS — L089 Local infection of the skin and subcutaneous tissue, unspecified: Secondary | ICD-10-CM | POA: Diagnosis not present

## 2024-03-18 MED ORDER — DOXYCYCLINE HYCLATE 100 MG PO CAPS: 100.0000 mg | ORAL_CAPSULE | Freq: Two times a day (BID) | ORAL | 0 refills | Status: AC

## 2024-03-18 NOTE — ED Triage Notes (Signed)
 Patient reports that she has a cysst to the right side of her right eye area x3 days. Patient states she also has weeping from the right eye. Patient has been using pimple spot treatment tot he area.

## 2024-03-18 NOTE — ED Provider Notes (Signed)
 [facility]ARE [facility]ENTER   [id number] 03/18/24 Arrival Time: 1522  ASSESSMENT & PLAN:  1. Localized bacterial skin infection    No signs of abscess formation; no indication for I&D. Begin: Meds ordered this encounter  Medications   doxycycline  (VIBRAMYCIN ) 100 MG capsule    Sig: Take 1 capsule (100 mg total) by mouth 2 (two) times daily.    Dispense:  14 capsule    Refill:  0     Follow-up Information     [facility]   Specialty: Urgent Care Why: If worsening or failing to improve as anticipated. Contact information: [address] [place]  [address] [phone]                Reviewed expectations re: course of current medical issues. Questions answered. Outlined signs and symptoms indicating need for more acute intervention. Understanding verbalized. After Visit Summary given.   SUBJECTIVE: History from: Patient. Shelby Russell is a 28 y.o. female. Ques skin infection of face just lateral to right eye; few days; denies drainage/bleeding. Denies: fever. Normal PO intake without n/v/d.  OBJECTIVE:  Vitals:   03/18/24 1624  BP: 125/85  Pulse: (!) 110  Resp: 16  Temp: 98.5 F (36.9 C)  TempSrc: Oral  SpO2: 95%    General appearance: alert; no distress HEENT: warm and dry; approx  0.5x1 cm erythematous induration just lateral to R eye; without fluctuance; mild edema around R eye; PERRLA; EOMI; conjunctiva normal on R Neurologic: normal gait Psychological: alert and cooperative; normal mood and affect  Labs: Results for orders placed or performed during the hospital encounter of 02/20/24  POC SARS Coronavirus 2 Ag-ED - Nasal Swab   Collection Time: 02/20/24  7:03 PM  Result Value Ref Range   SARS Coronavirus 2 Ag Negative Negative   Labs Reviewed - No data to display  Imaging: No results found.  Allergies  Allergen Reactions   Lamictal [Lamotrigine] Itching and Rash    Past Medical History:   Diagnosis Date   Abdominal pain 08/26/2011   Recent on-set.   Back pain 08/26/2011   Recent on-set.   Bulimic    OCD (obsessive compulsive disorder)    PTSD (post-traumatic stress disorder)    Social History   Socioeconomic History   Marital status: Single    Spouse name: Not on file   Number of children: Not on file   Years of education: Not on file   Highest education level: Not on file  Occupational History   Not on file  Tobacco Use   Smoking status: Former    Types: Cigarettes   Smokeless tobacco: Never  Vaping Use   Vaping status: Every Day   Substances: Nicotine  Substance and Sexual Activity   Alcohol use: Not Currently   Drug use: Not Currently   Sexual activity: Yes    Birth control/protection: None  Other Topics Concern   Not on file  Social History Narrative   ** Merged History Encounter **       Social Drivers of Health   Financial Resource Strain: Not on file  Food Insecurity: Not on file  Transportation Needs: Not on file  Physical Activity: Not on file  Stress: Not on file  Social Connections: Unknown (03/12/2023)   Received from [facility]   Social Network    Social Network: Not on file  Intimate Partner Violence: Unknown (03/12/2023)   Received from [facility]   HITS    Physically  Hurt: Not on file    Insult or Talk Down To: Not on file    Threaten Physical Harm: Not on file    Scream or Curse: Not on file   History reviewed. No pertinent family history. Past Surgical History:  Procedure Laterality Date   NO PAST SURGERIES       Rolinda Rogue, MD 03/18/24 [phone]

## 2024-03-31 ENCOUNTER — Other Ambulatory Visit: Payer: Self-pay

## 2024-03-31 ENCOUNTER — Emergency Department (HOSPITAL_BASED_OUTPATIENT_CLINIC_OR_DEPARTMENT_OTHER)
Admission: EM | Admit: 2024-03-31 | Discharge: 2024-03-31 | Disposition: A | Discharge status: Home / Self Care | Attending: Emergency Medicine | Admitting: Emergency Medicine

## 2024-03-31 ENCOUNTER — Emergency Department (HOSPITAL_BASED_OUTPATIENT_CLINIC_OR_DEPARTMENT_OTHER)

## 2024-03-31 DIAGNOSIS — S0990XA Unspecified injury of head, initial encounter: Secondary | ICD-10-CM

## 2024-03-31 DIAGNOSIS — W228XXA Striking against or struck by other objects, initial encounter: Secondary | ICD-10-CM | POA: Insufficient documentation

## 2024-03-31 DIAGNOSIS — S0083XA Contusion of other part of head, initial encounter: Secondary | ICD-10-CM | POA: Diagnosis not present

## 2024-03-31 MED ORDER — KETOROLAC TROMETHAMINE 15 MG/ML IJ SOLN
15.0000 mg | Freq: Once | INTRAMUSCULAR | Status: AC
  Administered 2024-03-31: 15 mg via INTRAMUSCULAR
  Filled 2024-03-31: qty 1

## 2024-03-31 NOTE — ED Notes (Signed)
 Pt d/c instructions, medications, and follow-up care reviewed with pt. Pt verbalized understanding and had no further questions at time of d/c. Pt CA&Ox4, ambulatory, and in NAD at time of d/c

## 2024-03-31 NOTE — ED Triage Notes (Signed)
 Pt POV reporting persistent headache and slight confusion I just feel out of it, after hitting head on car door yesterday. No LOC, no blood thinners, concerned for concussion.

## 2024-03-31 NOTE — ED Provider Notes (Signed)
 [place] EMERGENCY DEPARTMENT AT [facility] Provider Note   CSN: [id number] Arrival date & time: 03/31/24  1535     Patient presents with: Headache   Shelby Russell is a 28 y.o. female.   Patient with no pertinent past medical history presents today with complaints of head injury. She reports that she hit the front of her head on a car door yesterday. She did not loose consciousness, she is not anticoagulated.  She did not fall down.  She reports she did have some nausea afterwards but no vomiting. Does report she had an episode of seeing hours immediately following the event but has not had any since then.  Reports feeling out of it since the event.  Does report she has had several concussions previously and is concerned for same.   The history is provided by the patient. No language interpreter was used.  Headache      Prior to Admission medications   Medication Sig Start Date End Date Taking? Authorizing Provider  amphetamine-dextroamphetamine (ADDERALL XR) 10 MG 24 hr capsule Take 10 mg by mouth every morning. 12/01/23   [provider]  doxycycline  (VIBRAMYCIN ) 100 MG capsule Take 1 capsule (100 mg total) by mouth 2 (two) times daily. 03/18/24   Rolinda Rogue, MD  imipramine (TOFRANIL) 25 MG tablet Take by mouth. 11/25/23   [provider]  OLANZapine-FLUoxetine (SYMBYAX) 3-25 MG capsule Take 1 capsule by mouth at bedtime.    [provider]    Allergies: Lamictal [lamotrigine]    Review of Systems  Neurological:  Positive for headaches.  All other systems reviewed and are negative.   Updated Vital Signs BP (!) 126/93 (BP Location: Right Arm)   Pulse 99   Temp 97.6 F (36.4 C)   Resp 20   Ht 5' 2 (1.575 m)   Wt 63.5 kg   LMP 03/23/2024 (Approximate)   SpO2 98%   BMI 25.61 kg/m   Physical Exam Vitals and nursing note reviewed.  Constitutional:      General: She is not in acute distress.    Appearance: Normal  appearance. She is normal weight. She is not ill-appearing, toxic-appearing or diaphoretic.  HENT:     Head: Normocephalic and atraumatic.     Comments: Battle sign or raccoon eyes.  Small left frontal hematoma noted without overlying wound or crepitus. Cardiovascular:     Rate and Rhythm: Normal rate.  Pulmonary:     Effort: Pulmonary effort is normal. No respiratory distress.  Musculoskeletal:        General: Normal range of motion.     Cervical back: Normal range of motion.  Skin:    General: Skin is warm and dry.  Neurological:     General: No focal deficit present.     Mental Status: She is alert and oriented to person, place, and time.     GCS: GCS eye subscore is 4. GCS verbal subscore is 5. GCS motor subscore is 6.     Sensory: Sensation is intact.     Motor: Motor function is intact.     Coordination: Coordination is intact.     Gait: Gait is intact.     Comments: Alert and oriented to self, place, time and event.    Speech is fluent, clear without dysarthria or dysphasia.    Strength 5/5 in upper/lower extremities   Sensation intact in upper/lower extremities    CN I not tested  CN II grossly intact visual fields bilaterally.  Did not visualize posterior eye.  CN III, IV, VI PERRLA and EOMs intact bilaterally  CN V Intact sensation to sharp and light touch to the face  CN VII facial movements symmetric  CN VIII not tested  CN IX, X no uvula deviation, symmetric rise of soft palate  CN XI 5/5 SCM and trapezius strength bilaterally  CN XII Midline tongue protrusion, symmetric L/R movements   Psychiatric:        Mood and Affect: Mood normal.        Behavior: Behavior normal.     (all labs ordered are listed, but only abnormal results are displayed) Labs Reviewed - No data to display  EKG: None  Radiology: CT Head Wo Contrast Result Date: 03/31/2024 CLINICAL DATA:  Head trauma, moderate-severe EXAM: CT HEAD WITHOUT CONTRAST TECHNIQUE: Contiguous axial images  were obtained from the base of the skull through the vertex without intravenous contrast. RADIATION DOSE REDUCTION: This exam was performed according to the departmental dose-optimization program which includes automated exposure control, adjustment of the mA and/or kV according to patient size and/or use of iterative reconstruction technique. COMPARISON:  None Available. FINDINGS: Brain: Streak artifact from clips in hair limits assessment at the vertex. No intracranial hemorrhage, mass effect, or midline shift. No hydrocephalus. Incidental cavum septum pellucidum. The basilar cisterns are patent. No evidence of territorial infarct or acute ischemia. No extra-axial or intracranial fluid collection. Vascular: No hyperdense vessel or unexpected calcification. Skull: No fracture or focal lesion. Sinuses/Orbits: Paranasal sinuses and mastoid air cells are clear. The visualized orbits are unremarkable. Other: No confluent scalp hematoma. IMPRESSION: No acute intracranial abnormality. No skull fracture. Electronically Signed   By: Andrea Gasman M.D.   On: 03/31/2024 16:40     Procedures   Medications Ordered in the ED  ketorolac  (TORADOL ) 15 MG/ML injection 15 mg (15 mg Intramuscular Given 03/31/24 1635)                                    Medical Decision Making Amount and/or Complexity of Data Reviewed Radiology: ordered.  Risk Prescription drug management.   This patient is a 28 y.o. female who presents to the ED for concern of head injury  Past Medical History / Co-morbidities / Social History:  has a past medical history of Abdominal pain (08/26/2011), Back pain (08/26/2011), Bulimic, OCD (obsessive compulsive disorder), and PTSD (post-traumatic stress disorder).  Additional history: Chart reviewed.  Physical Exam: Physical exam performed. The pertinent findings include: Alert and oriented and neurologically intact without focal deficits.  Battle sign or raccoon eyes.  Small left  frontal hematoma noted without overlying wound or crepitus.   Imaging Studies: I ordered imaging studies including CT head. I independently visualized and interpreted imaging which showed no acute findings. I agree with the radiologist interpretation.   Medications: I ordered medication including Toradol  for pain. Reevaluation of the patient after these medicines showed that the patient improved. I have reviewed the patients home medicines and have made adjustments as needed.   Disposition: After consideration of the diagnostic results and the patients response to treatment, I feel that emergency department workup does not suggest an emergent condition requiring admission or immediate intervention beyond what has been performed at this time. The plan is: Discharge with close outpatient follow-up and return precautions.  Patient's workup is benign and she feels better and ready to go home.  Concussion precautions recommended and discussed.  Given information about the concussion clinic as well. Evaluation and diagnostic testing in the emergency department does not suggest an emergent condition requiring admission or immediate intervention beyond what has been performed at this time.  Plan for discharge with close PCP follow-up.  Patient is understanding and amenable with plan, educated on red flag symptoms that would prompt immediate return.  Patient discharged in stable condition.  Final diagnoses:  Injury of head, initial encounter    ED Discharge Orders     None     An After Visit Summary was printed and given to the patient.      Nora Lauraine LABOR, PA-C 03/31/24 1658    Mannie Pac T, DO 04/01/24 2319

## 2024-03-31 NOTE — Discharge Instructions (Signed)
 As we discussed, your workup in the ER today was reassuring for acute findings.  CT imaging of your head did not reveal any emergent concerns.  However, you could still have a concussion as this does not show up on a CT scan.  The management is based on your symptoms, I recommend that you get plenty of rest and drink plenty of fluids, limit your screen time as well as exposure to bright lights and loud noises.  Additionally, no contact sports until your symptoms have resolved.  Have given you information about the concussion clinic with a number to call to schedule an appointment as needed.  Follow-up with your PCP as well.  Return if development of any new or worsening symptoms.
